# Patient Record
Sex: Male | Born: 1990 | Race: White | Hispanic: No | Marital: Single
Health system: Southern US, Community
[De-identification: ages and names within clinical notes are randomized; demographics above are authoritative.]

---
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
## 2019-09-25 ENCOUNTER — Other Ambulatory Visit: Payer: Managed Care, Other (non HMO)

## 2019-09-25 ENCOUNTER — Other Ambulatory Visit: Payer: Self-pay

## 2019-09-25 DIAGNOSIS — Z20822 Contact with and (suspected) exposure to covid-19: Secondary | ICD-10-CM

## 2019-09-27 ENCOUNTER — Telehealth: Payer: Self-pay | Admitting: *Deleted

## 2019-09-27 LAB — SARS-COV-2, NAA 2 DAY TAT

## 2019-09-27 LAB — NOVEL CORONAVIRUS, NAA: SARS-CoV-2, NAA: NOT DETECTED

## 2019-09-27 NOTE — Telephone Encounter (Signed)
Patient calling for COVID test result- notified still pending. Patient has 2 charts- will request merge
